# Patient Record
Sex: Male | Born: 1970 | Race: Black or African American | Hispanic: No | Marital: Single | Smoking: Current every day smoker
Health system: Southern US, Community
[De-identification: ages and names within clinical notes are randomized; demographics above are authoritative.]

---

## 2011-01-18 ENCOUNTER — Ambulatory Visit: Payer: Self-pay | Admitting: Internal Medicine

## 2011-02-15 ENCOUNTER — Ambulatory Visit: Payer: Self-pay | Admitting: Internal Medicine

## 2011-02-15 DIAGNOSIS — Z0289 Encounter for other administrative examinations: Secondary | ICD-10-CM

## 2012-05-29 ENCOUNTER — Emergency Department (HOSPITAL_COMMUNITY): Payer: No Typology Code available for payment source

## 2012-05-29 ENCOUNTER — Encounter (HOSPITAL_COMMUNITY): Payer: Self-pay | Admitting: *Deleted

## 2012-05-29 ENCOUNTER — Emergency Department (HOSPITAL_COMMUNITY)
Admission: EM | Admit: 2012-05-29 | Discharge: 2012-05-29 | Disposition: A | Discharge status: Home / Self Care | Payer: No Typology Code available for payment source | Attending: Emergency Medicine | Admitting: Emergency Medicine

## 2012-05-29 DIAGNOSIS — M542 Cervicalgia: Secondary | ICD-10-CM | POA: Insufficient documentation

## 2012-05-29 DIAGNOSIS — S161XXA Strain of muscle, fascia and tendon at neck level, initial encounter: Secondary | ICD-10-CM

## 2012-05-29 DIAGNOSIS — R51 Headache: Secondary | ICD-10-CM | POA: Insufficient documentation

## 2012-05-29 DIAGNOSIS — Y92410 Unspecified street and highway as the place of occurrence of the external cause: Secondary | ICD-10-CM | POA: Insufficient documentation

## 2012-05-29 DIAGNOSIS — S139XXA Sprain of joints and ligaments of unspecified parts of neck, initial encounter: Secondary | ICD-10-CM | POA: Insufficient documentation

## 2012-05-29 DIAGNOSIS — S8000XA Contusion of unspecified knee, initial encounter: Secondary | ICD-10-CM | POA: Insufficient documentation

## 2012-05-29 MED ORDER — OXYCODONE-ACETAMINOPHEN 5-325 MG PO TABS
1.0000 | ORAL_TABLET | Freq: Once | ORAL | Status: AC
  Administered 2012-05-29: 1 via ORAL
  Filled 2012-05-29: qty 1

## 2012-05-29 MED ORDER — HYDROCODONE-ACETAMINOPHEN 5-500 MG PO TABS: 1.0000 | ORAL_TABLET | Freq: Four times a day (QID) | ORAL | Status: AC | PRN

## 2012-05-29 MED ORDER — IBUPROFEN 600 MG PO TABS: 600.0000 mg | ORAL_TABLET | Freq: Four times a day (QID) | ORAL | Status: AC | PRN

## 2012-05-29 MED ORDER — METAXALONE 800 MG PO TABS: 800.0000 mg | ORAL_TABLET | Freq: Three times a day (TID) | ORAL | Status: AC
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
## 2012-05-29 NOTE — ED Provider Notes (Signed)
Medical screening examination/treatment/procedure(s) were performed by non-physician practitioner and as supervising physician I was immediately available for consultation/collaboration.  Akita Maxim M Djeneba Barsch, MD 05/29/12 0812 

## 2013-06-23 ENCOUNTER — Emergency Department (HOSPITAL_COMMUNITY)
Admission: EM | Admit: 2013-06-23 | Discharge: 2013-06-23 | Disposition: A | Discharge status: Home / Self Care | Payer: BC Managed Care – PPO | Attending: Emergency Medicine | Admitting: Emergency Medicine

## 2013-06-23 ENCOUNTER — Emergency Department (HOSPITAL_COMMUNITY): Payer: BC Managed Care – PPO

## 2013-06-23 ENCOUNTER — Encounter (HOSPITAL_COMMUNITY): Payer: Self-pay | Admitting: *Deleted

## 2013-06-23 DIAGNOSIS — R079 Chest pain, unspecified: Secondary | ICD-10-CM

## 2013-06-23 DIAGNOSIS — F1410 Cocaine abuse, uncomplicated: Secondary | ICD-10-CM | POA: Insufficient documentation

## 2013-06-23 DIAGNOSIS — Z8639 Personal history of other endocrine, nutritional and metabolic disease: Secondary | ICD-10-CM | POA: Insufficient documentation

## 2013-06-23 DIAGNOSIS — Z880 Allergy status to penicillin: Secondary | ICD-10-CM | POA: Insufficient documentation

## 2013-06-23 DIAGNOSIS — Z79899 Other long term (current) drug therapy: Secondary | ICD-10-CM | POA: Insufficient documentation

## 2013-06-23 DIAGNOSIS — Z862 Personal history of diseases of the blood and blood-forming organs and certain disorders involving the immune mechanism: Secondary | ICD-10-CM | POA: Insufficient documentation

## 2013-06-23 DIAGNOSIS — R0789 Other chest pain: Secondary | ICD-10-CM | POA: Insufficient documentation

## 2013-06-23 DIAGNOSIS — F17200 Nicotine dependence, unspecified, uncomplicated: Secondary | ICD-10-CM | POA: Insufficient documentation

## 2013-06-23 LAB — RAPID URINE DRUG SCREEN, HOSP PERFORMED
Barbiturates: NOT DETECTED
Benzodiazepines: NOT DETECTED

## 2013-06-23 LAB — CBC
MCH: 32.3 pg (ref 26.0–34.0)
MCHC: 35.3 g/dL (ref 30.0–36.0)
MCV: 91.5 fL (ref 78.0–100.0)
Platelets: 194 10*3/uL (ref 150–400)
RBC: 4.92 MIL/uL (ref 4.22–5.81)
RDW: 11.8 % (ref 11.5–15.5)

## 2013-06-23 LAB — POCT I-STAT TROPONIN I
Troponin i, poc: 0 ng/mL (ref 0.00–0.08)
Troponin i, poc: 0.01 ng/mL (ref 0.00–0.08)

## 2013-06-23 LAB — BASIC METABOLIC PANEL
CO2: 30 mEq/L (ref 19–32)
Calcium: 9.6 mg/dL (ref 8.4–10.5)
Creatinine, Ser: 1.4 mg/dL — ABNORMAL HIGH (ref 0.50–1.35)

## 2013-06-23 MED ORDER — ASPIRIN 325 MG PO TABS
325.0000 mg | ORAL_TABLET | Freq: Once | ORAL | Status: AC
  Administered 2013-06-23: 325 mg via ORAL
  Filled 2013-06-23: qty 1

## 2013-06-23 MED ORDER — KETOROLAC TROMETHAMINE 30 MG/ML IJ SOLN: 30.0000 mg | Freq: Once | INTRAMUSCULAR | Status: DC

## 2013-06-23 MED ORDER — MORPHINE SULFATE 4 MG/ML IJ SOLN: 4.0000 mg | Freq: Once | INTRAMUSCULAR | Status: DC

## 2013-06-23 MED ORDER — GI COCKTAIL ~~LOC~~
30.0000 mL | Freq: Once | ORAL | Status: AC
  Administered 2013-06-23: 30 mL via ORAL
  Filled 2013-06-23: qty 30

## 2013-06-23 NOTE — ED Notes (Signed)
[id number]<YQ> Expected date:<BR> Expected time:<BR> Means of arrival:<BR> Comments:<BR> Triage 1

## 2013-06-23 NOTE — ED Notes (Signed)
Pt reports woke up this morning with central chest pain 5/10. Pt reported "chest tightness". 3 year smoker, 1/2 pack/day.  Pt denies SOB.

## 2013-06-23 NOTE — ED Provider Notes (Signed)
CSN: [id number]     Arrival date & time 06/23/13  1202 History     First MD Initiated Contact with Patient 06/23/13 1216     Chief Complaint  Patient presents with  . Chest Pain   (Consider location/radiation/quality/duration/timing/severity/associated sxs/prior Treatment) The history is provided by the patient.  Derek Fleming is a 42 y.o. male history of high cholesterol here presenting with chest pain. Intermittent chest tightness since yesterday. It is substernal and occasional radiation to the right as well as to the back. It was worse this morning. He did have some marijuana yesterday but denies any cocaine use. He does smoke every day as well. Denies fevers or chills. Denies history of MIs. His father died of MI age 39s.    History reviewed. No pertinent past medical history. History reviewed. No pertinent past surgical history. History reviewed. No pertinent family history. History  Substance Use Topics  . Smoking status: Current Every Day Smoker -- 0.10 packs/day for 3 years    Types: Cigarettes  . Smokeless tobacco: Not on file  . Alcohol Use: Yes     Comment: daily, 2-3 beers    Review of Systems  Cardiovascular: Positive for chest pain.  All other systems reviewed and are negative.    Allergies  Penicillins  Home Medications   Current Outpatient Rx  Name  Route  Sig  Dispense  Refill  . naproxen sodium (ANAPROX) 220 MG tablet   Oral   Take 220 mg by mouth 2 (two) times daily as needed (for pain).         . vitamin A 91478 UNIT capsule   Oral   Take 10,000 Units by mouth daily.         . vitamin B-12 (CYANOCOBALAMIN) 1000 MCG tablet   Oral   Take 1,000 mcg by mouth daily.          BP 142/90  Pulse 63  Temp(Src) 98.3 F (36.8 C) (Oral)  Resp 18  SpO2 99% Physical Exam  Nursing note and vitals reviewed. Constitutional: He is oriented to person, place, and time. He appears well-developed and well-nourished.  HENT:  Head: Normocephalic.   Mouth/Throat: Oropharynx is clear and moist.  Eyes: Conjunctivae are normal. Pupils are equal, round, and reactive to light.  Neck: Normal range of motion. Neck supple.  Cardiovascular: Normal rate, regular rhythm and normal heart sounds.   Pulmonary/Chest: Effort normal and breath sounds normal. No respiratory distress. He has no wheezes. He has no rales. He exhibits no tenderness.  Abdominal: Soft. Bowel sounds are normal. He exhibits no distension. There is no tenderness. There is no rebound and no guarding.  Musculoskeletal: Normal range of motion. He exhibits no edema and no tenderness.  Neurological: He is alert and oriented to person, place, and time.  Skin: Skin is warm and dry.  Psychiatric: He has a normal mood and affect. His behavior is normal. Judgment and thought content normal.    ED Course   Procedures (including critical care time)  Labs Reviewed  BASIC METABOLIC PANEL - Abnormal; Notable for the following:    Creatinine, Ser 1.40 (*)    GFR calc non Af Amer 61 (*)    GFR calc Af Amer 70 (*)    All other components within normal limits  URINE RAPID DRUG SCREEN (HOSP PERFORMED) - Abnormal; Notable for the following:    Cocaine POSITIVE (*)    All other components within normal limits  CBC  POCT I-STAT TROPONIN  I  POCT I-STAT TROPONIN I   Dg Chest 2 View  06/23/2013   RADIOLOGY REPORT  CHEST - 2 VIEW  CLINICAL DATA:  Chest pain  Comparison:  none  Findings: The heart, mediastinal, and hilar contours are normal. The lungs are well-expanded and clear. Negative for pleural abnormality or pneumothorax. No acute bony abnormality.  IMPRESSION:  No acute cardiopulmonary disease.   Original Report Authenticated By: Britta Mccreedy, M.D.   No diagnosis found.   Date: 06/23/2013- 1  Rate: 66  Rhythm: normal sinus rhythm  QRS Axis: normal  Intervals: normal  ST/T Wave abnormalities: early repolarization  Conduction Disutrbances:none  Narrative Interpretation:   Old EKG  Reviewed: none available   Date: 06/23/2013- 2  Rate: 71  Rhythm: normal sinus rhythm  QRS Axis: normal  Intervals: normal  ST/T Wave abnormalities: early repolarization  Conduction Disutrbances:none  Narrative Interpretation:   Old EKG Reviewed: unchanged      MDM  KORTNEY SCHOENFELDER is a 42 y.o. male here with chest tightness. Given family history and that he is a smoker, will need trop x 2 for r/o ACS.   3:49 PM Trop neg x 2. UDS + cocaine. I told him to avoid cocaine and other drugs or smoking. F/u with his doctor.    Richardean Canal, MD 06/23/13 [phone]

## 2013-06-23 NOTE — ED Notes (Signed)
Pt reports R side cp which started yesterday which stopped but recurred today.  Pt denies any SOB, lightheaded, dizziness or n/v at this time.

## 2014-10-30 IMAGING — CR DG CHEST 2V
2 series · 2 of 2 positions shown · non-contrast
Comparison: { none}

CHEST - 2 VIEW
CLINICAL DATA: Chest pain

[w chest pa]
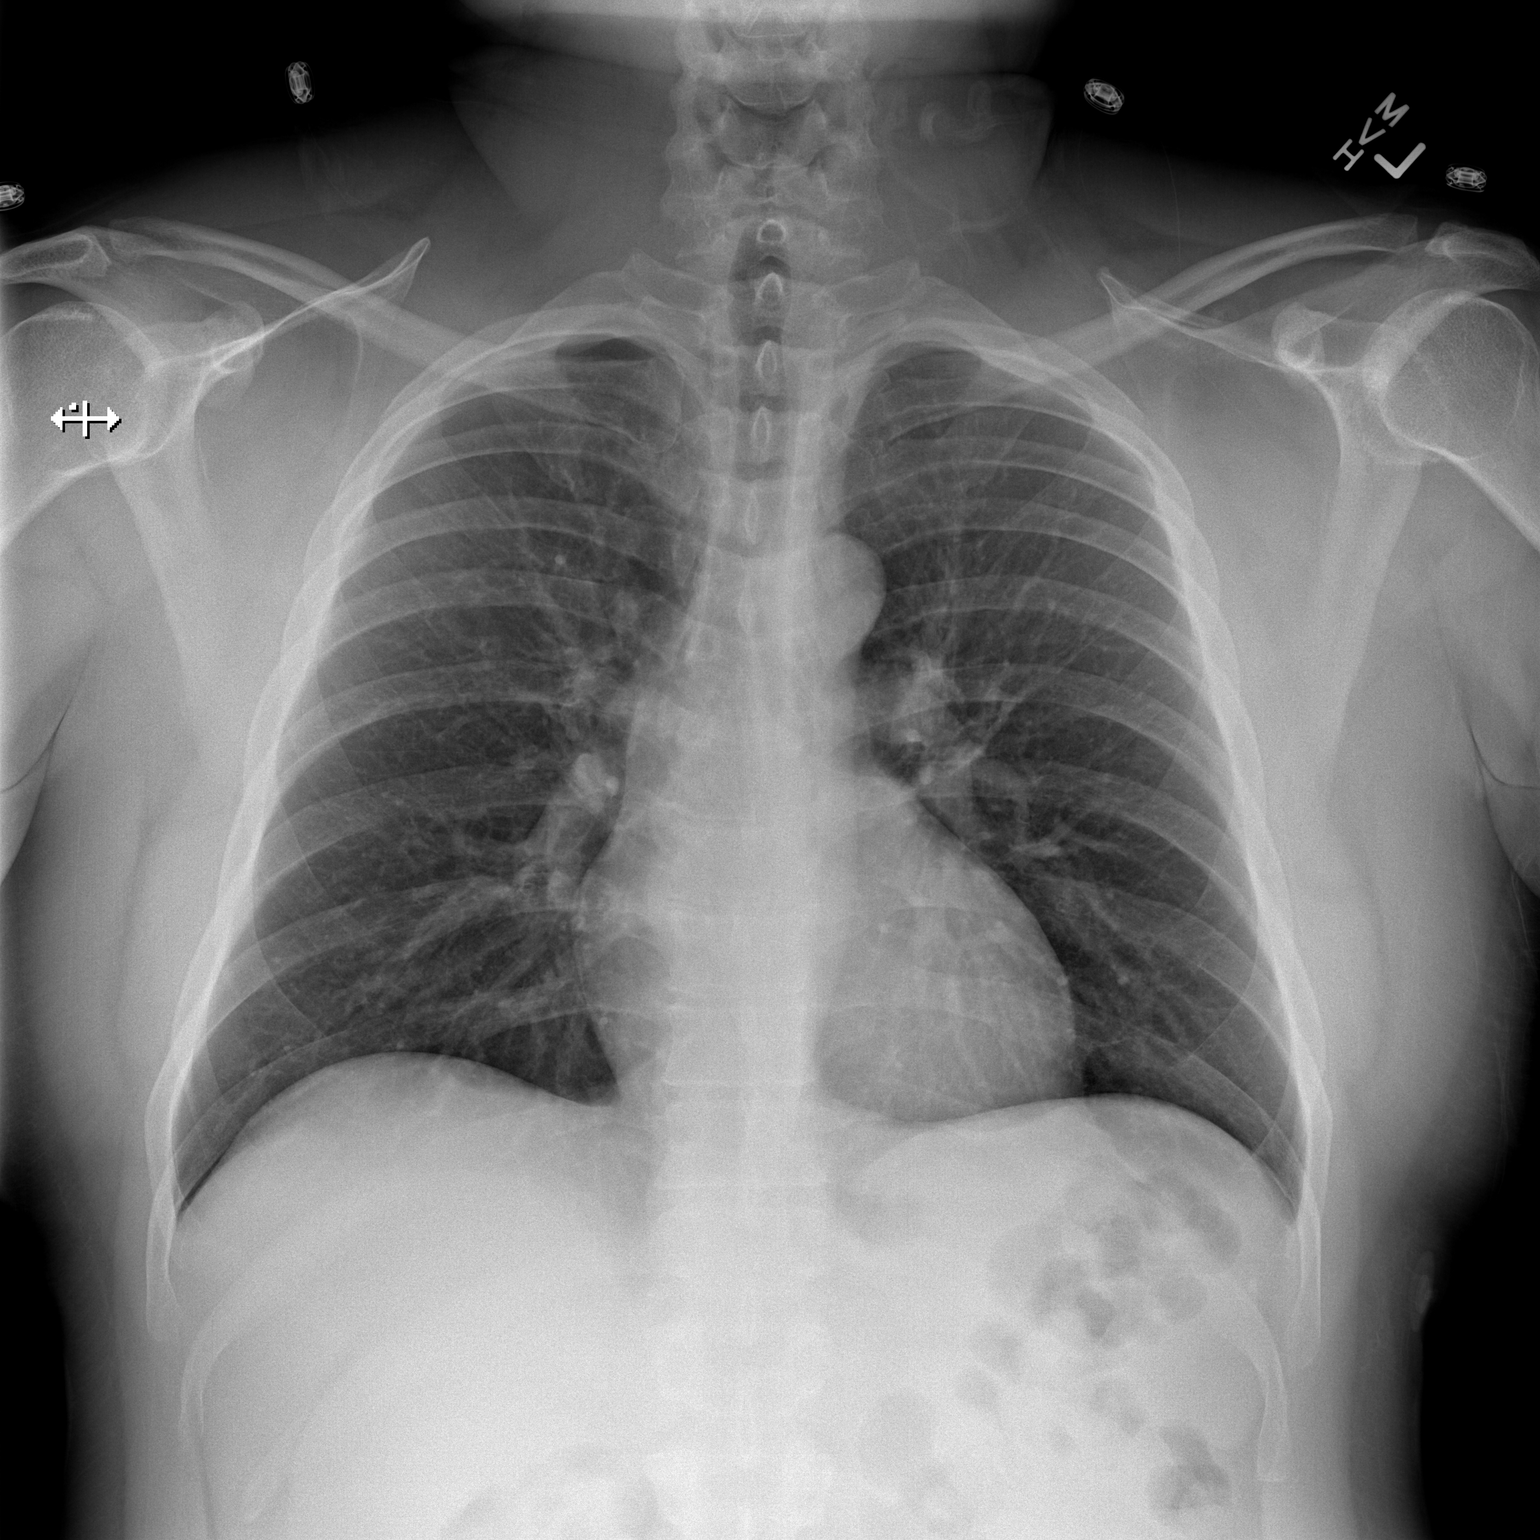

[w chest lat]
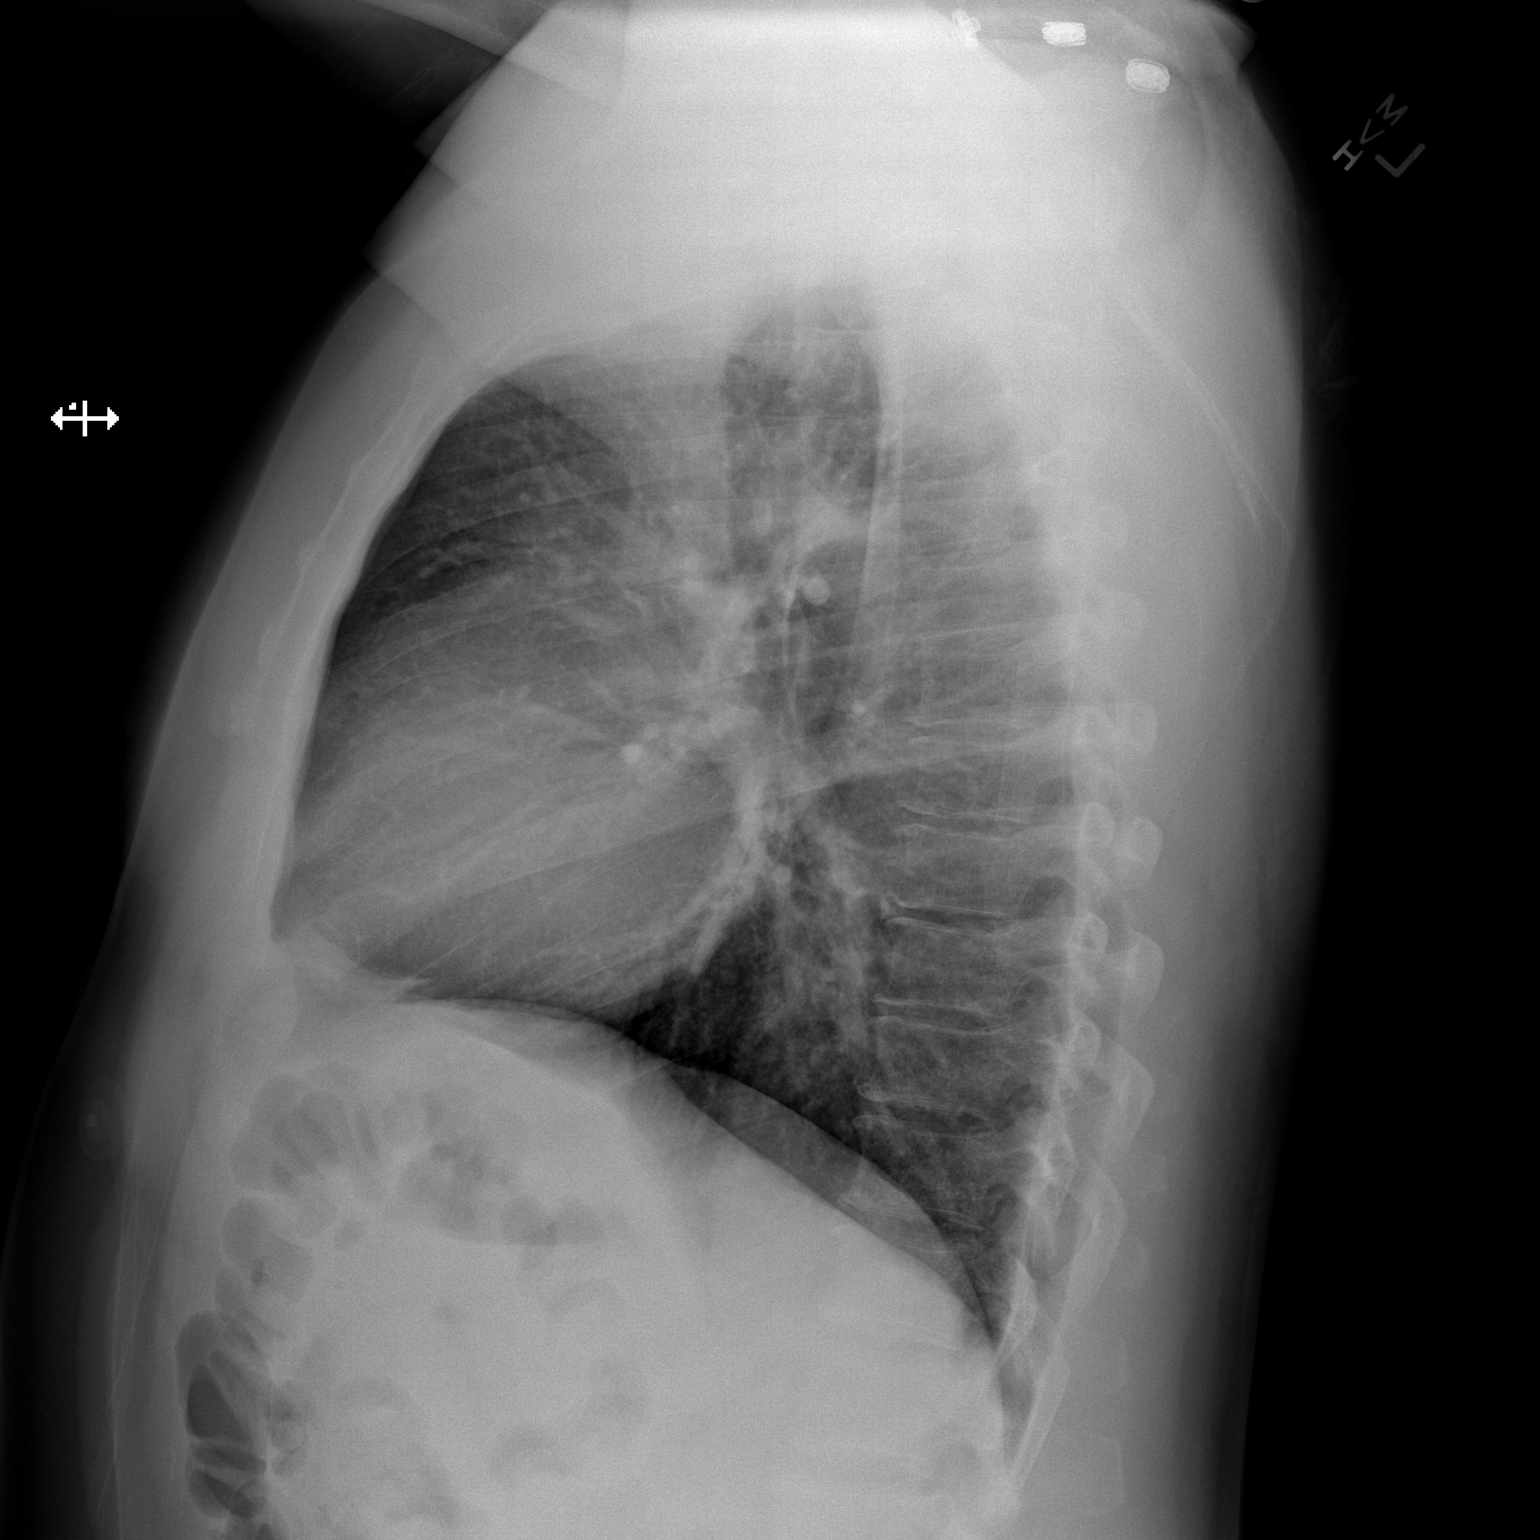

[2 of 2 positions shown; findings below may reference images not displayed]

FINDINGS: The heart, mediastinal, and hilar contours are normal.
The lungs are well-expanded and clear. Negative for pleural
abnormality or pneumothorax. No acute bony abnormality.
IMPRESSION: No acute cardiopulmonary disease.
